# Patient Record
Sex: Male | Born: 1970 | Hispanic: No | Marital: Married
Health system: Southern US, Community
[De-identification: ages and names within clinical notes are randomized; demographics above are authoritative.]

## PROBLEM LIST (undated history)

## (undated) DIAGNOSIS — I10 Essential (primary) hypertension: Secondary | ICD-10-CM

---

## 2020-06-03 ENCOUNTER — Emergency Department (HOSPITAL_COMMUNITY)
Admission: EM | Admit: 2020-06-03 | Discharge: 2020-06-03 | Disposition: A | Discharge status: Home / Self Care | Payer: Self-pay | Attending: Emergency Medicine | Admitting: Emergency Medicine

## 2020-06-03 ENCOUNTER — Emergency Department (HOSPITAL_COMMUNITY): Payer: Self-pay

## 2020-06-03 ENCOUNTER — Encounter (HOSPITAL_COMMUNITY): Payer: Self-pay

## 2020-06-03 ENCOUNTER — Other Ambulatory Visit: Payer: Self-pay

## 2020-06-03 DIAGNOSIS — I10 Essential (primary) hypertension: Secondary | ICD-10-CM | POA: Insufficient documentation

## 2020-06-03 DIAGNOSIS — N200 Calculus of kidney: Secondary | ICD-10-CM | POA: Insufficient documentation

## 2020-06-03 DIAGNOSIS — R8271 Bacteriuria: Secondary | ICD-10-CM | POA: Insufficient documentation

## 2020-06-03 HISTORY — DX: Essential (primary) hypertension: I10

## 2020-06-03 LAB — URINALYSIS, ROUTINE W REFLEX MICROSCOPIC
Bilirubin Urine: NEGATIVE
Glucose, UA: NEGATIVE mg/dL
Ketones, ur: NEGATIVE mg/dL
Leukocytes,Ua: NEGATIVE
Nitrite: NEGATIVE
Protein, ur: 30 mg/dL — AB
RBC / HPF: >50 RBC/hpf — ABNORMAL HIGH (ref 0–5)
Specific Gravity, Urine: 1.034 — ABNORMAL HIGH (ref 1.005–1.030)
pH: 5 (ref 5.0–8.0)

## 2020-06-03 LAB — CBC WITH DIFFERENTIAL/PLATELET
Abs Immature Granulocytes: 0.03 10*3/uL (ref 0.00–0.07)
Basophils Absolute: 0.1 10*3/uL (ref 0.0–0.1)
Basophils Relative: 1 %
Eosinophils Absolute: 0.1 10*3/uL (ref 0.0–0.5)
Eosinophils Relative: 2 %
HCT: 45.1 % (ref 39.0–52.0)
Hemoglobin: 14.5 g/dL (ref 13.0–17.0)
Immature Granulocytes: 0 %
Lymphocytes Relative: 33 %
Lymphs Abs: 2.6 10*3/uL (ref 0.7–4.0)
MCH: 26.5 pg (ref 26.0–34.0)
MCHC: 32.2 g/dL (ref 30.0–36.0)
MCV: 82.4 fL (ref 80.0–100.0)
Monocytes Absolute: 0.7 10*3/uL (ref 0.1–1.0)
Monocytes Relative: 8 %
Neutro Abs: 4.4 10*3/uL (ref 1.7–7.7)
Neutrophils Relative %: 56 %
Platelets: 366 10*3/uL (ref 150–400)
RBC: 5.47 MIL/uL (ref 4.22–5.81)
RDW: 14 % (ref 11.5–15.5)
WBC: 7.9 10*3/uL (ref 4.0–10.5)
nRBC: 0 % (ref 0.0–0.2)

## 2020-06-03 LAB — COMPREHENSIVE METABOLIC PANEL
ALT: 24 U/L (ref 0–44)
AST: 19 U/L (ref 15–41)
Albumin: 4.3 g/dL (ref 3.5–5.0)
Alkaline Phosphatase: 86 U/L (ref 38–126)
Anion gap: 10 (ref 5–15)
BUN: 17 mg/dL (ref 6–20)
CO2: 25 mmol/L (ref 22–32)
Calcium: 8.9 mg/dL (ref 8.9–10.3)
Chloride: 103 mmol/L (ref 98–111)
Creatinine, Ser: 1 mg/dL (ref 0.61–1.24)
GFR calc Af Amer: >60 mL/min (ref >60)
GFR calc non Af Amer: >60 mL/min (ref >60)
Glucose, Bld: 171 mg/dL — ABNORMAL HIGH (ref 70–99)
Potassium: 3.4 mmol/L — ABNORMAL LOW (ref 3.5–5.1)
Sodium: 138 mmol/L (ref 135–145)
Total Bilirubin: 0.3 mg/dL (ref 0.3–1.2)
Total Protein: 7.5 g/dL (ref 6.5–8.1)

## 2020-06-03 MED ORDER — TAMSULOSIN HCL 0.4 MG PO CAPS: 0.4000 mg | ORAL_CAPSULE | Freq: Every day | ORAL | 0 refills | Status: AC

## 2020-06-03 MED ORDER — HYDROMORPHONE HCL 1 MG/ML IJ SOLN
1.0000 mg | Freq: Once | INTRAMUSCULAR | Status: AC
  Administered 2020-06-03: 1 mg via INTRAVENOUS
  Filled 2020-06-03: qty 1

## 2020-06-03 MED ORDER — KETOROLAC TROMETHAMINE 15 MG/ML IJ SOLN
15.0000 mg | Freq: Once | INTRAMUSCULAR | Status: AC
  Administered 2020-06-03: 15 mg via INTRAVENOUS
  Filled 2020-06-03: qty 1

## 2020-06-03 MED ORDER — SODIUM CHLORIDE 0.9 % IV SOLN
1.0000 g | Freq: Once | INTRAVENOUS | Status: AC
  Administered 2020-06-03: 1 g via INTRAVENOUS
  Filled 2020-06-03: qty 10

## 2020-06-03 MED ORDER — OXYCODONE-ACETAMINOPHEN 5-325 MG PO TABS: 1.0000 | ORAL_TABLET | Freq: Four times a day (QID) | ORAL | 0 refills | Status: AC | PRN

## 2020-06-03 MED ORDER — ONDANSETRON 4 MG PO TBDP: 4.0000 mg | ORAL_TABLET | Freq: Three times a day (TID) | ORAL | 0 refills | Status: AC | PRN

## 2020-06-03 MED ORDER — POTASSIUM CHLORIDE CRYS ER 20 MEQ PO TBCR
40.0000 meq | EXTENDED_RELEASE_TABLET | Freq: Once | ORAL | Status: AC
  Administered 2020-06-03: 40 meq via ORAL
  Filled 2020-06-03: qty 2

## 2020-06-03 MED ORDER — IBUPROFEN 600 MG PO TABS: 600.0000 mg | ORAL_TABLET | Freq: Three times a day (TID) | ORAL | 0 refills | Status: AC | PRN

## 2020-06-03 MED ORDER — CEPHALEXIN 500 MG PO CAPS: 500.0000 mg | ORAL_CAPSULE | Freq: Four times a day (QID) | ORAL | 0 refills | Status: AC

## 2020-06-03 MED ORDER — SODIUM CHLORIDE 0.9 % IV BOLUS
1000.0000 mL | Freq: Once | INTRAVENOUS | Status: AC
  Administered 2020-06-03: 1000 mL via INTRAVENOUS

## 2020-06-03 MED ORDER — ONDANSETRON HCL 4 MG/2ML IJ SOLN
4.0000 mg | Freq: Once | INTRAMUSCULAR | Status: AC
  Administered 2020-06-03: 4 mg via INTRAVENOUS
  Filled 2020-06-03: qty 2

## 2020-06-03 NOTE — ED Triage Notes (Signed)
Pt sts right flank pain for 1 hour. Dysuria.

## 2020-06-03 NOTE — Discharge Instructions (Addendum)
You were seen in the emergency department and found to have a kidney stone.  We are sending you home with multiple medications to assist with passing the stone:   -Flomax-this is a medication to help pass the stone, it allows urine to exit the body more freely.  Please take this once daily with a meal.  -Ibuprofen 800 mg-this is a medication that will help with pain as well as passing the stone.  Please take this every 8 hours.  Take this with food as it can cause stomach upset and at worst stomach bleeding.  Do not take other NSAIDs such as Motrin, Aleve, Advil, Mobic, or Naproxen with this medicine as they are similar and would propagate any potential side effects.   -Percocet-this is a narcotic/controlled substance medication that has potential addicting qualities.  We recommend that you take 1-2 tablets every 6 hours as needed for severe pain.  Do not drive or operate heavy machinery when taking this medicine as it can be sedating. Do not drink alcohol or take other sedating medications when taking this medicine for safety reasons.  Keep this out of reach of small children.  Please be aware this medicine has Tylenol in it (325 mg/tab) do not exceed the maximum dose of Tylenol in a day per over the counter recommendations should you decide to supplement with Tylenol over the counter.   -Zofran-this is an antinausea medication, you may take this every 8 hours as needed for nausea and vomiting, please allow the tablet to dissolve underneath of your tongue.   - Keflex-this is an antibiotic to take as your urine had bacteria present in it.  We have prescribed you new medication(s) today. Discuss the medications prescribed today with your pharmacist as they can have adverse effects and interactions with your other medicines including over the counter and prescribed medications. Seek medical evaluation if you start to experience new or abnormal symptoms after taking one of these medicines, seek care  immediately if you start to experience difficulty breathing, feeling of your throat closing, facial swelling, or rash as these could be indications of a more serious allergic reaction  Please follow-up with the urology group provided in your discharge instructions within 3 to 5 days.  Return to the ER for new or worsening symptoms including but not limited to worsening pain not controlled by these medicines, inability to keep fluids down, fever, or any other concerns that you may have.

## 2020-06-03 NOTE — ED Notes (Signed)
Patient transported to CT 

## 2020-06-03 NOTE — ED Provider Notes (Signed)
[facility]-EMERGENCY DEPT Provider Note   CSN: [id number] Arrival date & time: 06/03/20  0422     History Chief Complaint  Patient presents with  . Flank Pain    Donald Parker is a 49 y.o. male with a history of hypertension who presents to the ED with complaints of flank pain that began 1 hour PTA. Patient states pain woke him from sleep, located in the R flank, radiates into the lower abdomen/groin, currently severe. No alleviating/aggravating factors. Reports associated frequency, dysuria, and nausea. Denies fever, chills, emesis, diarrhea, constipation, hematuria, testicular swelling, numbness, or weakness.   HPI     Past Medical History:  Diagnosis Date  . Hypertension     There are no problems to display for this patient.   History reviewed. No pertinent surgical history.     No family history on file.  Social History   Tobacco Use  . Smoking status: Not on file  Substance Use Topics  . Alcohol use: Not on file  . Drug use: Not on file    Home Medications Prior to Admission medications   Not on File    Allergies    Patient has no known allergies.  Review of Systems   Review of Systems  Constitutional: Negative for chills and fever.  Respiratory: Negative for shortness of breath.   Cardiovascular: Negative for chest pain.  Gastrointestinal: Positive for nausea. Negative for blood in stool, constipation, diarrhea and vomiting.  Genitourinary: Positive for dysuria and frequency. Negative for hematuria and scrotal swelling.  Neurological: Negative for syncope, weakness and numbness.  All other systems reviewed and are negative.   Physical Exam Updated Vital Signs BP (!) 157/108 (BP Location: Right Arm)   Pulse 87   Temp 98 F (36.7 C) (Oral)   Resp 14   Ht 5\' 8"  (1.727 m)   Wt 72.6 kg   SpO2 96%   BMI 24.33 kg/m   Physical Exam Vitals and nursing note reviewed. Exam conducted with a chaperone present.  Constitutional:       Appearance: He is well-developed. He is not toxic-appearing.     Comments: Appears uncomfortable.   HENT:     Head: Normocephalic and atraumatic.  Eyes:     General:        Right eye: No discharge.        Left eye: No discharge.     Conjunctiva/sclera: Conjunctivae normal.  Cardiovascular:     Rate and Rhythm: Normal rate and regular rhythm.     Pulses:          Radial pulses are 2+ on the right side and 2+ on the left side.       Dorsalis pedis pulses are 2+ on the right side and 2+ on the left side.  Pulmonary:     Effort: Pulmonary effort is normal. No respiratory distress.     Breath sounds: Normal breath sounds. No wheezing, rhonchi or rales.  Abdominal:     General: There is no distension.     Palpations: Abdomen is soft.     Tenderness: There is no abdominal tenderness. There is right CVA tenderness. There is no left CVA tenderness, guarding or rebound.  Genitourinary:    Penis: Circumcised.      Testes:        Right: Mass, tenderness or swelling not present.        Left: Mass, tenderness or swelling not present.     Epididymis:  Right: No tenderness.     Left: No tenderness.  Musculoskeletal:     Cervical back: Neck supple.  Skin:    General: Skin is warm and dry.     Findings: No rash.  Neurological:     Mental Status: He is alert.     Comments: Clear speech.   Psychiatric:        Behavior: Behavior normal.    ED Results / Procedures / Treatments   Labs (all labs ordered are listed, but only abnormal results are displayed) Labs Reviewed  URINALYSIS, ROUTINE W REFLEX MICROSCOPIC - Abnormal; Notable for the following components:      Result Value   APPearance TURBID (*)    Specific Gravity, Urine 1.034 (*)    Hgb urine dipstick LARGE (*)    Protein, ur 30 (*)    RBC / HPF >50 (*)    Bacteria, UA MANY (*)    All other components within normal limits  COMPREHENSIVE METABOLIC PANEL - Abnormal; Notable for the following components:   Potassium 3.4  (*)    Glucose, Bld 171 (*)    All other components within normal limits  URINE CULTURE  CBC WITH DIFFERENTIAL/PLATELET    EKG None  Radiology CT Renal Stone Study  Result Date: 06/03/2020 CLINICAL DATA:  Right flank pain. Kidney stone suspected. Pain began 2 hours ago. EXAM: CT ABDOMEN AND PELVIS WITHOUT CONTRAST TECHNIQUE: Multidetector CT imaging of the abdomen and pelvis was performed following the standard protocol without IV contrast. COMPARISON:  None. FINDINGS: Lower chest: The lung bases are clear without focal nodule, mass, or airspace disease. Heart size is normal. Hepatobiliary: No focal liver abnormality is seen. No gallstones, gallbladder wall thickening, or biliary dilatation. Pancreas: Unremarkable. No pancreatic ductal dilatation or surrounding inflammatory changes. Spleen: Normal in size without focal abnormality. Adrenals/Urinary Tract: The adrenal glands are normal bilaterally. Left kidney and ureter are within normal limits. Moderate right-sided hydronephrosis is present. The right ureter is dilated to the level of the UVJ where a 2.5 mm obstructing stone is present. No other stones are present. The urinary bladder is within normal limits. Stomach/Bowel: The stomach and duodenum are within normal limits. Small bowel is unremarkable. Terminal ileum is normal. Appendix is visualized and within normal limits. The ascending and transverse colon are normal. Descending and sigmoid colon are normal. Vascular/Lymphatic: No significant vascular findings are present. No enlarged abdominal or pelvic lymph nodes. Reproductive: Prostate is unremarkable. Other: No abdominal wall hernia or abnormality. No abdominopelvic ascites. Musculoskeletal: Short pedicles and disc bulging contribute to central and foraminal narrowing in the lumbar spine. No focal lytic or blastic lesions are present. IMPRESSION: 1. Obstructing 2.5 mm stone at the right UVJ with moderate right-sided hydronephrosis. 2. No other  stones are present. 3. Short pedicles and disc bulging contribute to central and foraminal narrowing in the lumbar spine. Electronically Signed   By: Marin Roberts M.D.   On: 06/03/2020 05:42    Procedures Procedures (including critical care time)  Medications Ordered in ED Medications - No data to display  ED Course  I have reviewed the triage vital signs and the nursing notes.  Pertinent labs & imaging results that were available during my care of the patient were reviewed by me and considered in my medical decision making (see chart for details).    MDM Rules/Calculators/A&P  Patient presents to the ED with complaints of R flank pain that began 1 hour PTA. On exam patient appears uncomfortable, R CVA tenderness, no abdominal tenderness/peritoneal signs. Testicular exam benign- chaperone present.   DDX: Nephrolithiasis, pyelonephritis/UTI, MSK pain, dissection, appendicitis.  Additional history obtained:  Additional history obtained from chart review & nursing note review.   Lab Tests:  I Ordered, reviewed, and interpreted labs, which included:  CBC: No significant anemia or leukocytosis. CMP: Mild hypokalemia, will orally replaced.  Mild hyperglycemia without acidosis or anion gap elevation. Urinalysis: Dehydration with elevated specific gravity, hematuria, does have many bacteria.  Imaging Studies ordered:  I ordered imaging studies which included CT renal stone study, I independently visualized and interpreted imaging which showed 1. Obstructing 2.5 mm stone at the right UVJ with moderate right-sided hydronephrosis. 2. No other stones are present. 3. Short pedicles and disc bulging contribute to central and foraminal narrowing in the lumbar spine.   2.5 mm right-sided UVJ stone, likely cause of patient's pain, renal function is preserved, patient does have many bacteria present in urine, will send for culture, start Rocephin, and discussed with  urology.  Potassium has also been ordered for his hypokalemia.  06:17: CONSULT: Discussed case with Dr. Franchot Heidelberg- on call for urology-in agreement with discharge home with strict return precautions and urology follow-up, recommends placing patient on keflex and providing flomax prescription. Appreciate consultation.   On re-assessment patient is feeling much better, he is tolerating PO, overall appears appropriate for discharge at this time. I discussed results, treatment plan, need for follow-up, and return precautions with the patient. Provided opportunity for questions, patient confirmed understanding and is in agreement with plan.   Findings and plan of care discussed with supervising physician Dr. Manus Gunning who has evaluated patient & is in agreement.   Portions of this note were generated with Scientist, clinical (histocompatibility and immunogenetics). Dictation errors may occur despite best attempts at proofreading.  Final Clinical Impression(s) / ED Diagnoses Final diagnoses:  Kidney stone  Bacteriuria    Rx / DC Orders ED Discharge Orders         Ordered    ibuprofen (ADVIL) 600 MG tablet  Every 8 hours PRN     Discontinue  Reprint     06/03/20 0646    oxyCODONE-acetaminophen (PERCOCET/ROXICET) 5-325 MG tablet  Every 6 hours PRN     Discontinue  Reprint     06/03/20 0646    ondansetron (ZOFRAN ODT) 4 MG disintegrating tablet  Every 8 hours PRN     Discontinue  Reprint     06/03/20 0646    tamsulosin (FLOMAX) 0.4 MG CAPS capsule  Daily after breakfast     Discontinue  Reprint     06/03/20 0646    cephALEXin (KEFLEX) 500 MG capsule  4 times daily     Discontinue  Reprint     06/03/20 0646           Ahmar Pickrell, Pleas Koch, PA-C 06/03/20 [id number]    Glynn Octave, MD 06/03/20 [phone]

## 2020-06-04 LAB — URINE CULTURE: Culture: NO GROWTH

## 2022-02-16 IMAGING — CT CT RENAL STONE PROTOCOL
2 of 4 series · 16 of 46 positions shown, 18 images · non-contrast
Comparison: None.

CLINICAL DATA: Right flank pain. Kidney stone suspected. Pain began
2 hours ago.

EXAM:
CT ABDOMEN AND PELVIS WITHOUT CONTRAST
TECHNIQUE: Multidetector CT imaging of the abdomen and pelvis was performed
following the standard protocol without IV contrast.

[Series 2: axial st · axial · 0.81mm/px · z∈[+1048,+1462]mm · 13 of 95 slices shown, 15 images]
[im 6/95  soft-tissue]
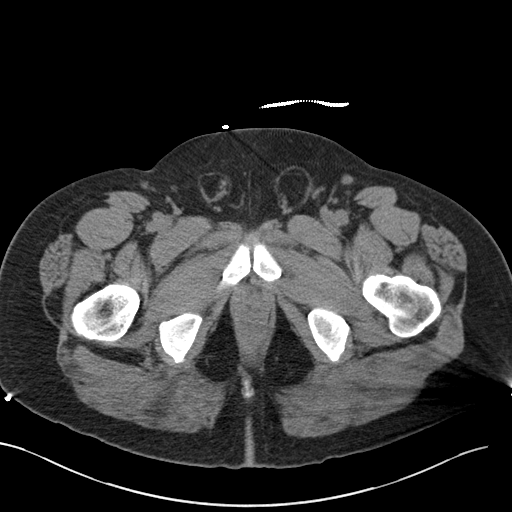
[im 6/95  bone]
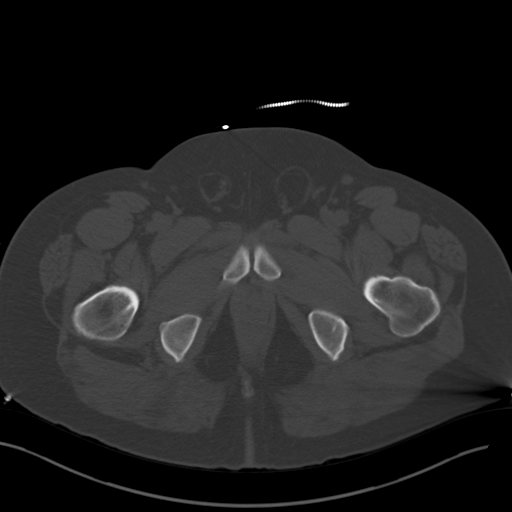
[im 11/95  soft-tissue]
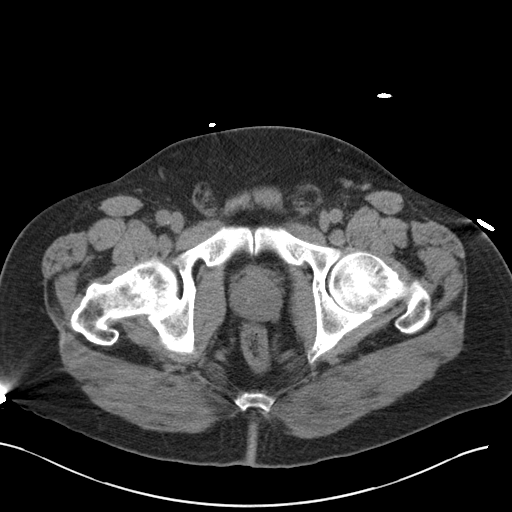
[im 21/95  soft-tissue]
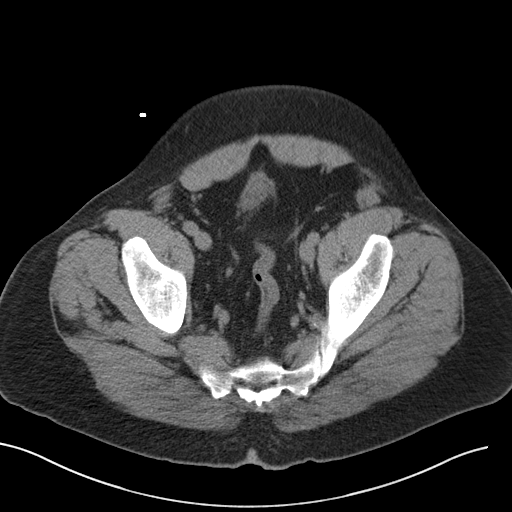
[im 27/95  soft-tissue]
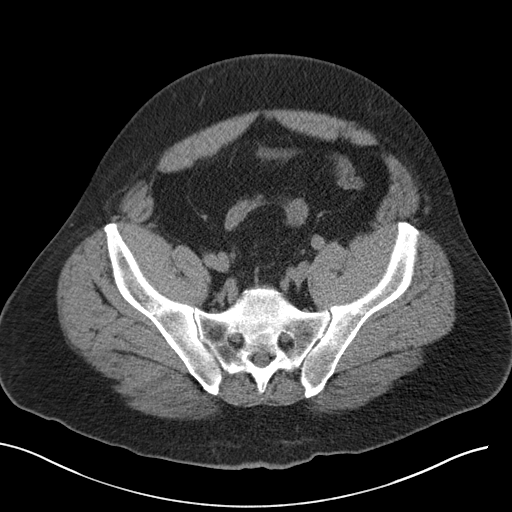
[im 32/95  soft-tissue]
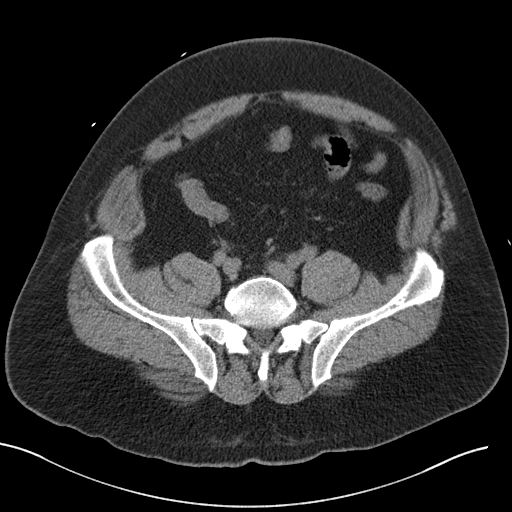
[im 42/95  soft-tissue]
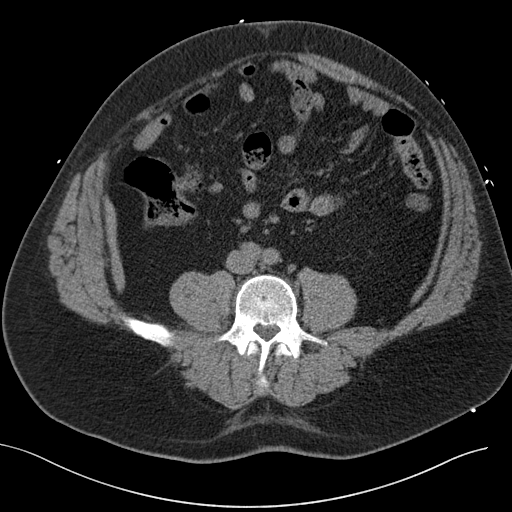
[im 48/95  soft-tissue]
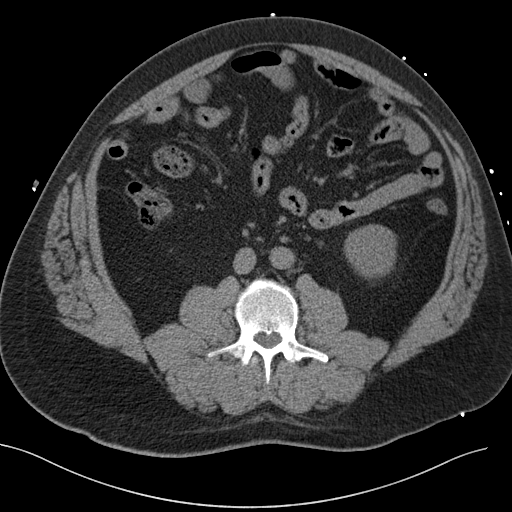
[im 53/95  soft-tissue]
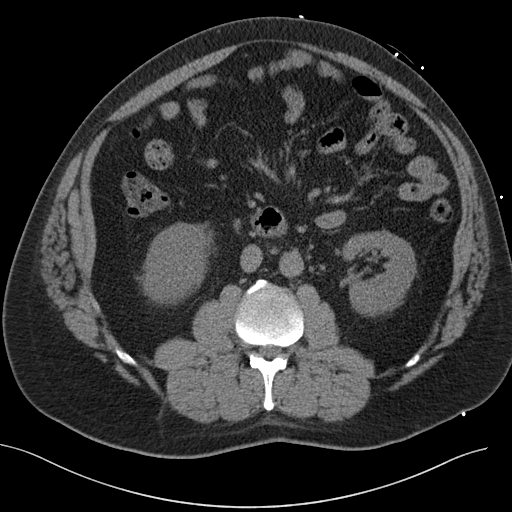
[im 63/95  soft-tissue]
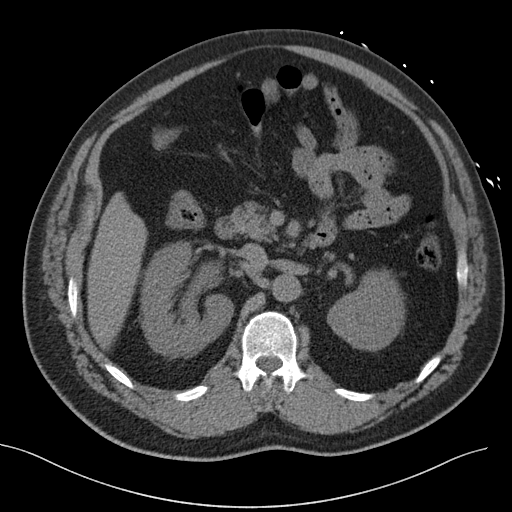
[im 63/95  bone]
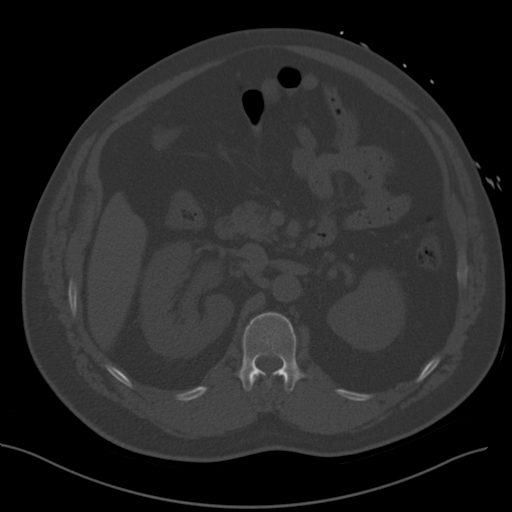
[im 68/95  soft-tissue]
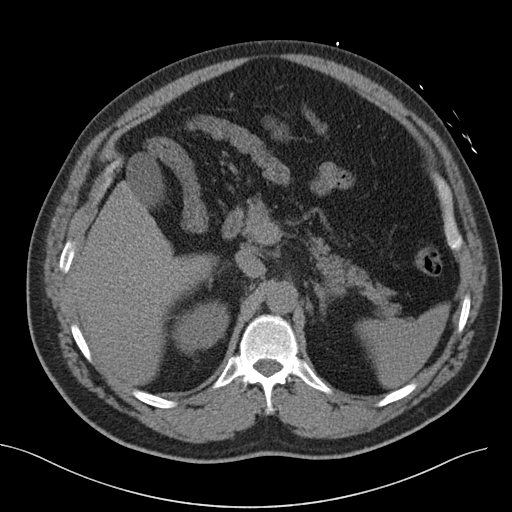
[im 74/95  soft-tissue]
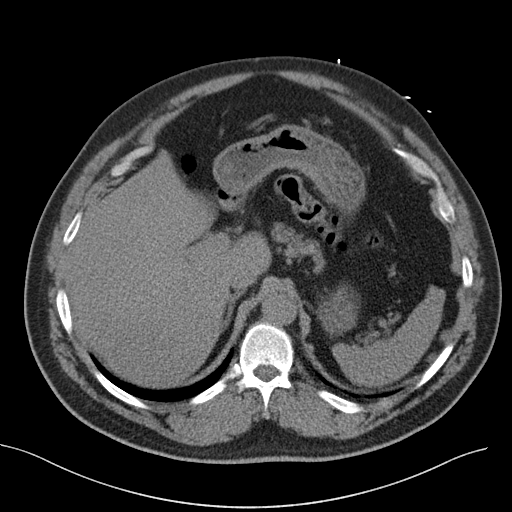
[im 84/95  soft-tissue]
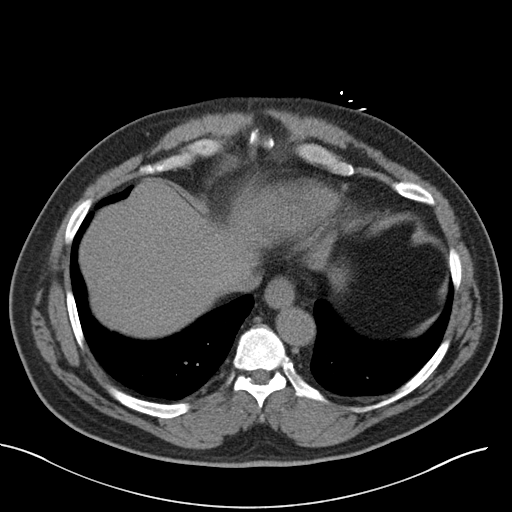
[im 89/95  soft-tissue]
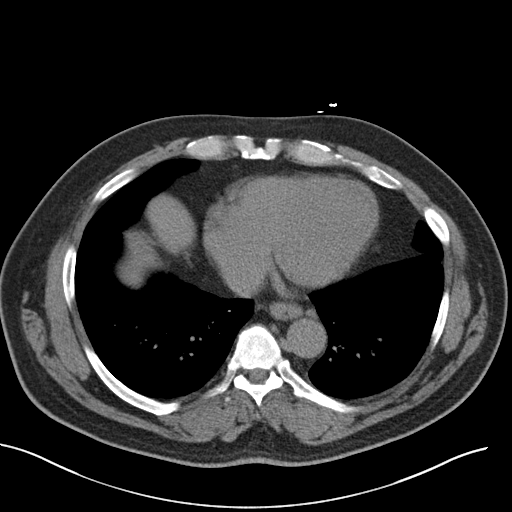

[Series 4: coronal · coronal · 0.80mm/px · 3 of 169 slices shown]
[im 57/169  soft-tissue]
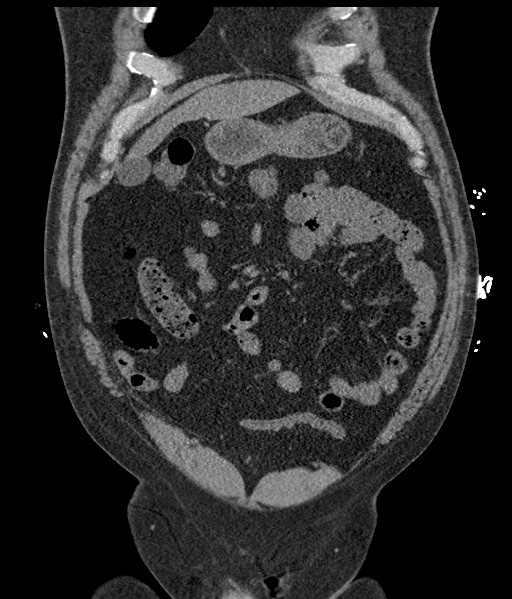
[im 75/169  soft-tissue]
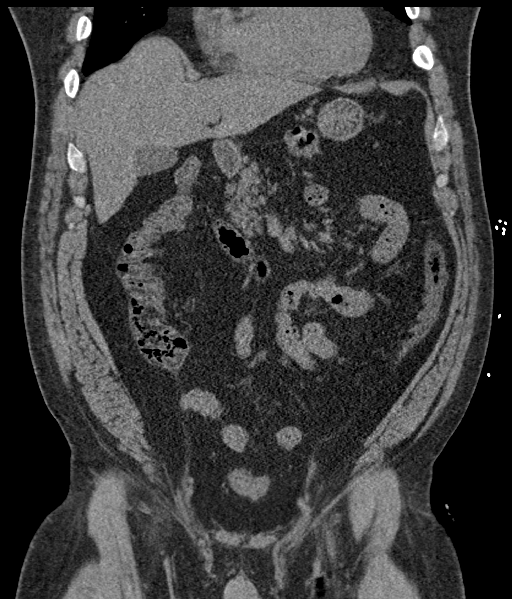
[im 94/169  soft-tissue]
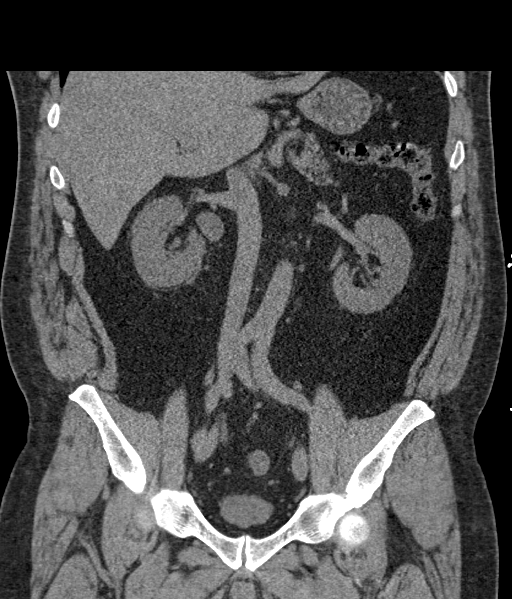

[16 of 46 positions shown; findings below may reference images not displayed]

FINDINGS: Lower chest: The lung bases are clear without focal nodule, mass, or
airspace disease. Heart size is normal.

Hepatobiliary: No focal liver abnormality is seen. No gallstones,
gallbladder wall thickening, or biliary dilatation.

Pancreas: Unremarkable. No pancreatic ductal dilatation or
surrounding inflammatory changes.

Spleen: Normal in size without focal abnormality.

Adrenals/Urinary Tract: The adrenal glands are normal bilaterally.
Left kidney and ureter are within normal limits. Moderate
right-sided hydronephrosis is present. The right ureter is dilated
to the level of the UVJ where a 2.5 mm obstructing stone is present.
No other stones are present. The urinary bladder is within normal
limits.

Stomach/Bowel: The stomach and duodenum are within normal limits.
Small bowel is unremarkable. Terminal ileum is normal. Appendix is
visualized and within normal limits. The ascending and transverse
colon are normal. Descending and sigmoid colon are normal.

Vascular/Lymphatic: No significant vascular findings are present. No
enlarged abdominal or pelvic lymph nodes.

Reproductive: Prostate is unremarkable.

Other: No abdominal wall hernia or abnormality. No abdominopelvic
ascites.

Musculoskeletal:

Short pedicles and disc bulging contribute to central and foraminal
narrowing in the lumbar spine. No focal lytic or blastic lesions are
present.
IMPRESSION: 1. Obstructing 2.5 mm stone at the right UVJ with moderate
right-sided hydronephrosis.
2. No other stones are present.
3. Short pedicles and disc bulging contribute to central and
foraminal narrowing in the lumbar spine.
